# Patient Record
Sex: Male | Born: 1975 | Race: White | Hispanic: No | Marital: Married | Smoking: Former smoker
Health system: Southern US, Community
[De-identification: ages and names within clinical notes are randomized; demographics above are authoritative.]

## PROBLEM LIST (undated history)

## (undated) DIAGNOSIS — L409 Psoriasis, unspecified: Secondary | ICD-10-CM

---

## 2016-01-04 ENCOUNTER — Emergency Department (HOSPITAL_COMMUNITY)
Admission: EM | Admit: 2016-01-04 | Discharge: 2016-01-04 | Disposition: A | Discharge status: Home / Self Care | Payer: Worker's Compensation | Attending: Emergency Medicine | Admitting: Emergency Medicine

## 2016-01-04 ENCOUNTER — Encounter (HOSPITAL_COMMUNITY): Payer: Self-pay | Admitting: Neurology

## 2016-01-04 ENCOUNTER — Emergency Department (HOSPITAL_COMMUNITY): Payer: Worker's Compensation

## 2016-01-04 DIAGNOSIS — Y92239 Unspecified place in hospital as the place of occurrence of the external cause: Secondary | ICD-10-CM | POA: Diagnosis not present

## 2016-01-04 DIAGNOSIS — Y9389 Activity, other specified: Secondary | ICD-10-CM | POA: Diagnosis not present

## 2016-01-04 DIAGNOSIS — W278XXA Contact with other nonpowered hand tool, initial encounter: Secondary | ICD-10-CM | POA: Insufficient documentation

## 2016-01-04 DIAGNOSIS — Y990 Civilian activity done for income or pay: Secondary | ICD-10-CM | POA: Insufficient documentation

## 2016-01-04 DIAGNOSIS — Z23 Encounter for immunization: Secondary | ICD-10-CM | POA: Insufficient documentation

## 2016-01-04 DIAGNOSIS — R55 Syncope and collapse: Secondary | ICD-10-CM | POA: Insufficient documentation

## 2016-01-04 DIAGNOSIS — S51812A Laceration without foreign body of left forearm, initial encounter: Secondary | ICD-10-CM

## 2016-01-04 DIAGNOSIS — Z87891 Personal history of nicotine dependence: Secondary | ICD-10-CM | POA: Diagnosis not present

## 2016-01-04 HISTORY — DX: Psoriasis, unspecified: L40.9

## 2016-01-04 MED ORDER — ACETAMINOPHEN 500 MG PO TABS: 1000.0000 mg | ORAL_TABLET | Freq: Four times a day (QID) | ORAL | 0 refills | Status: AC | PRN

## 2016-01-04 MED ORDER — LIDOCAINE-EPINEPHRINE (PF) 2 %-1:200000 IJ SOLN
20.0000 mL | Freq: Once | INTRAMUSCULAR | Status: AC
  Administered 2016-01-04: 20 mL
  Filled 2016-01-04: qty 20

## 2016-01-04 MED ORDER — TRAMADOL HCL 50 MG PO TABS: 100.0000 mg | ORAL_TABLET | Freq: Four times a day (QID) | ORAL | 0 refills | Status: AC | PRN

## 2016-01-04 MED ORDER — MORPHINE SULFATE (PF) 4 MG/ML IV SOLN
4.0000 mg | Freq: Once | INTRAVENOUS | Status: AC
  Administered 2016-01-04: 4 mg via INTRAVENOUS
  Filled 2016-01-04: qty 1

## 2016-01-04 MED ORDER — TETANUS-DIPHTH-ACELL PERTUSSIS 5-2.5-18.5 LF-MCG/0.5 IM SUSP
0.5000 mL | Freq: Once | INTRAMUSCULAR | Status: AC
  Administered 2016-01-04: 0.5 mL via INTRAMUSCULAR
  Filled 2016-01-04: qty 0.5

## 2016-01-04 MED FILL — Medication: Qty: 1 | Status: AC

## 2016-01-04 NOTE — ED Notes (Signed)
Pt returned from xray. Talking to wife on the phone. PA to bedside to assess wound to prepare to suture .

## 2016-01-04 NOTE — ED Provider Notes (Signed)
LACERATION REPAIR Performed by: Cleon DewGEIPLE,Clance Baquero S, O'Connor PA-S2 Authorized by: Carolee RotaGEIPLE,Kelise Kuch S Consent: Verbal consent obtained. Risks and benefits: risks, benefits and alternatives were discussed Consent given by: patient Patient identity confirmed: provided demographic data Prepped and Draped in normal sterile fashion Wound explored  Laceration Location: L upper arm  Laceration Length: 7cm  No Foreign Bodies seen or palpated  Anesthesia: local infiltration  Local anesthetic: lidocaine 2% with epinephrine  Anesthetic total: 12 ml  Irrigation method: bulb suction, bulk flow with 1000cc+ of NS Amount of cleaning: copius  Skin closure: 4-0 Vicryl, muscle closure #2 4-0 Vicryl, fascial closure #4 4-0 Ethilon, superficial closure #11  Technique: simple interrupted  Patient tolerance: Patient tolerated the procedure well with no immediate complications.   The patient was urged to return to the Emergency Department urgently with worsening pain, swelling, expanding erythema especially if it streaks away from the affected area, fever, or if they have any other concerns. Patient verbalized understanding.     Renne CriglerJoshua Saiya Crist, PA-C 01/04/16 1423    Steve BarretteMarcy Pfeiffer, MD 01/05/16 1036

## 2016-01-04 NOTE — ED Triage Notes (Signed)
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 RNs were nearby, sat him in wheelchair and applied pressure. While sitting had syncope episode, agonal breathing, assisted to the floor. At current pt is a x 4.

## 2016-01-04 NOTE — ED Notes (Signed)
PA finished with suturing

## 2016-01-04 NOTE — ED Provider Notes (Signed)
[facility]-EMERGENCY DEPT Provider Note   CSN: [id number] Arrival date & time: 01/04/16  1116     History   Chief Complaint Chief Complaint  Patient presents with  . Laceration    HPI Steve Sandoval is a 40 y.o. male.  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 He was using a screwdriver and ended up forcefully stabbing himself in the left, volar forearm. No fall or other injury. Patient was being transported down to the emergency department when he became sweaty lightheaded and passed out. Transporters placed patient in supine position and he regained consciousness. He reports he does have normal use of the hand. Past Medical History:  Diagnosis Date  . Psoriasis     There are no active problems to display for this patient.   History reviewed. No pertinent surgical history.     Home Medications    Prior to Admission medications   Medication Sig Start Date End Date Taking? Authorizing Provider  acetaminophen (TYLENOL) 500 MG tablet Take 2 tablets (1,000 mg total) by mouth every 6 (six) hours as needed. 01/04/16   Arby BarretteMarcy Damel Querry, MD  traMADol (ULTRAM) 50 MG tablet Take 2 tablets (100 mg total) by mouth every 6 (six) hours as needed. 01/04/16   Arby BarretteMarcy Reinaldo Helt, MD    Family History No family history on file.  Social History Social History  Substance Use Topics  . Smoking status: Former Games developermoker  . Smokeless tobacco: Not on file  . Alcohol use Yes     Allergies   Patient has no known allergies.   Review of Systems Review of Systems  Constitutional: No recent fever chills or general illness Cardiovascular: No chest pain, history of exertional chest pain or other syncopal episodes. Physical Exam Updated Vital Signs BP 140/93 (BP Location: Right Arm)   Pulse 100   Temp 97.9 F (36.6 C) (Oral)   Resp 23   Ht 6\' 1"  (1.854 m)   Wt 230 lb (104.3 kg)   SpO2 98%   BMI 30.34 kg/m   Physical Exam  Constitutional: He is oriented to person, place,  and time.  Patient is alert and nontoxic. No respiratory distress. Mental status clear. Color good.  HENT:  Head: Normocephalic and atraumatic.  Eyes: EOM are normal.  Cardiovascular: Normal rate, regular rhythm, normal heart sounds and intact distal pulses.   Pulmonary/Chest: Effort normal and breath sounds normal.  Abdominal: Soft. He exhibits no distension. There is no tenderness.  Musculoskeletal:  Left volar forearm has a laceration to the mid forearm. This is approximate 7 cm with the skin layers being completely penetrated showing a clean muscle body below with one defect in the muscle body consistent with a deeper puncture. Slow ooze of bleeding, no pulsatile bleeding. Patient's strength of the hand is intact. Pulse 2+.  Neurological: He is alert and oriented to person, place, and time. No cranial nerve deficit. He exhibits normal muscle tone. Coordination normal.  Skin: Skin is warm and dry.  Psychiatric: He has a normal mood and affect.     ED Treatments / Results  Labs (all labs ordered are listed, but only abnormal results are displayed) Labs Reviewed - No data to display  EKG  EKG Interpretation None       Radiology Dg Forearm Left  Result Date: 01/04/2016 CLINICAL DATA:  Laceration and soft tissue wound of left forearm. Initial encounter. EXAM: LEFT FOREARM - 2 VIEW COMPARISON:  None. FINDINGS: No fracture is identified. Dressing noted overlying mid  to proximal forearm soft tissues. No evidence of soft tissue foreign body. No bony lesions. IMPRESSION: No evidence of fracture or soft tissue foreign body. Electronically Signed   By: Irish LackGlenn  Yamagata M.D.   On: 01/04/2016 12:16    Procedures Procedures (including critical care time) Laceration. Repair done with PA-C Geipel. I have examined the wound and explored with sterile glove. Muscle body has punctured it does not appear to traverse any tendon or nerve structure. Patient is able to flex and extend the fingers and  the wrist and I do not feel movement of tendon beneath my finger. I have anesthetized the wound with 2% lidocaine with epinephrine with good result. One residual venous vessel required cauterization in the fatty tissues of the skin. Wound is copiously irrigated. At that point the bleeding controlled and PA-C-Geipel completed irrigation and repair. Medications Ordered in ED Medications  Tdap (BOOSTRIX) injection 0.5 mL (0.5 mLs Intramuscular Given 01/04/16 1130)  lidocaine-EPINEPHrine (XYLOCAINE W/EPI) 2 %-1:200000 (PF) injection 20 mL (20 mLs Infiltration Given 01/04/16 1222)  morphine 4 MG/ML injection 4 mg (4 mg Intravenous Given 01/04/16 1246)     Initial Impression / Assessment and Plan / ED Course  I have reviewed the triage vital signs and the nursing notes.  Pertinent labs & imaging results that were available during my care of the patient were reviewed by me and considered in my medical decision making (see chart for details).  Clinical Course     Final Clinical Impressions(s) / ED Diagnoses   Final diagnoses:  Forearm laceration with complication, left, initial encounter  Vasovagal syncope   Patient sustained forearm wound is outlined. Repair done in the emergency department. He experienced vagal syncope during transport to the emergency department. At this time no indication of acute cardiovascular problem or other trauma. New Prescriptions New Prescriptions   ACETAMINOPHEN (TYLENOL) 500 MG TABLET    Take 2 tablets (1,000 mg total) by mouth every 6 (six) hours as needed.   TRAMADOL (ULTRAM) 50 MG TABLET    Take 2 tablets (100 mg total) by mouth every 6 (six) hours as needed.     Arby BarretteMarcy Ermalinda Joubert, MD 01/04/16 [phone]

## 2016-01-04 NOTE — Discharge Instructions (Signed)
Stitches out in 10 days. See her family doctor for recheck. If you have signs of wound infection such as redness drainage, fever or other concerns return to the emergency department.

## 2017-10-27 IMAGING — DX DG FOREARM 2V*L*
2 series · 2 of 2 positions shown · non-contrast
Comparison: None.

CLINICAL DATA: Laceration and soft tissue wound of left forearm.
Initial encounter.

EXAM:
LEFT FOREARM - 2 VIEW

[x forearm ap left]
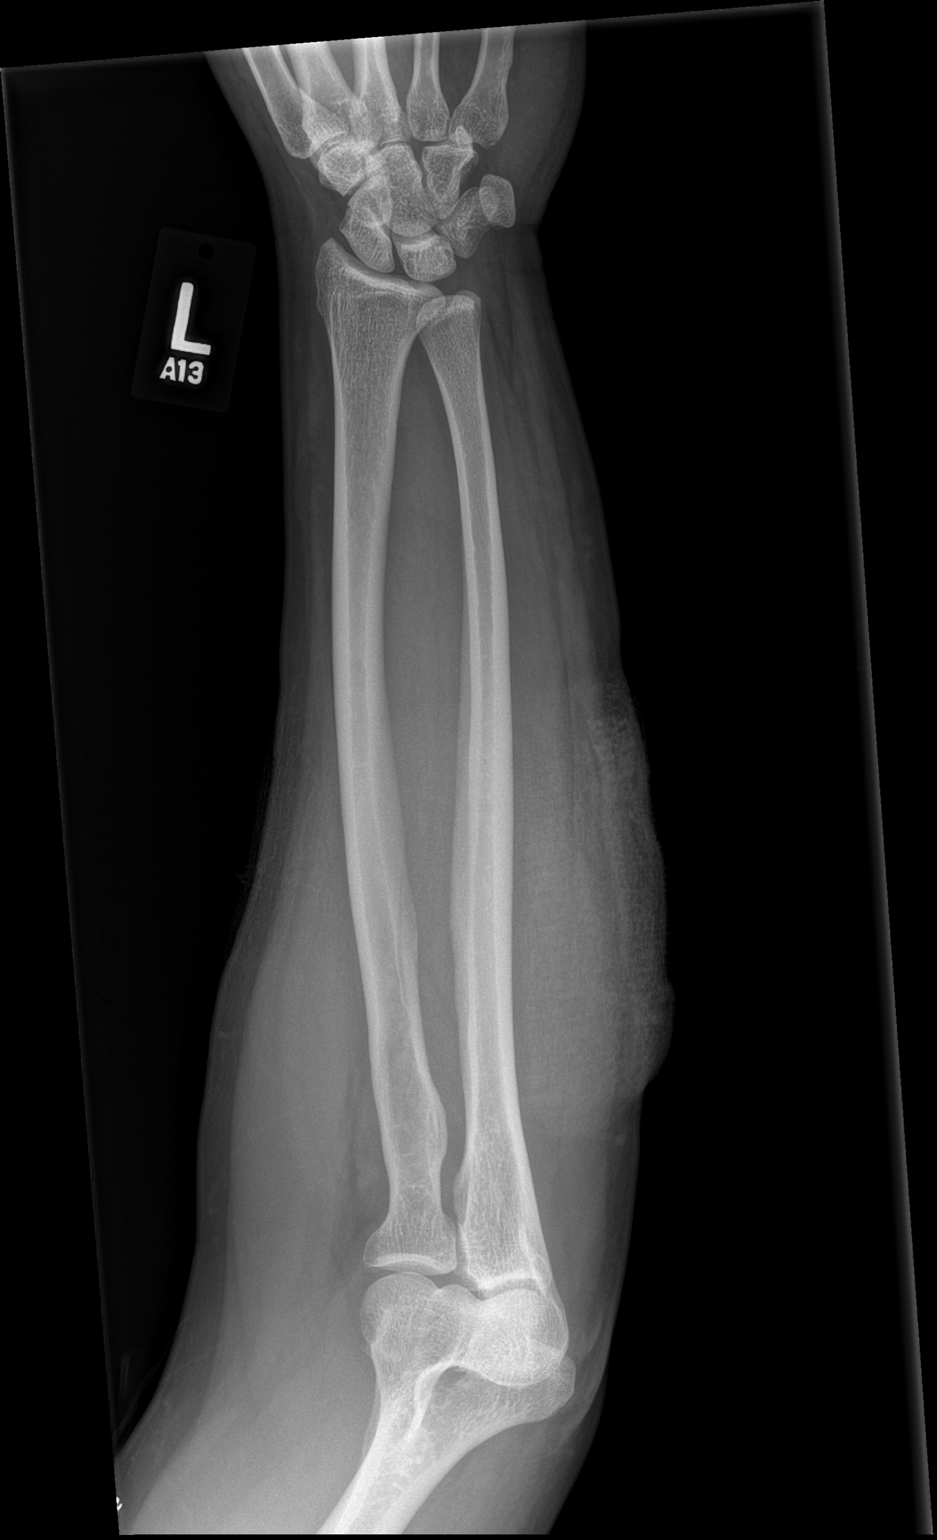

[x forearm lat left]
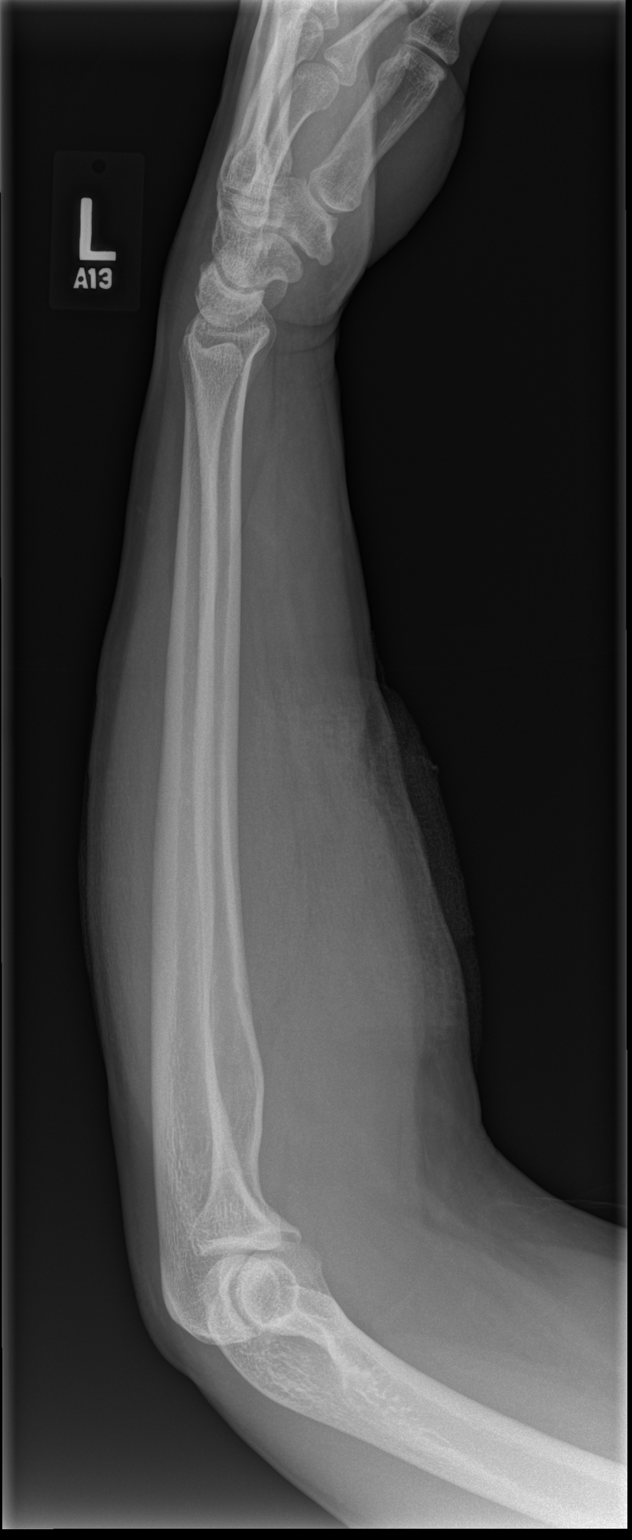

[2 of 2 positions shown; findings below may reference images not displayed]

FINDINGS: No fracture is identified. Dressing noted overlying mid to proximal
forearm soft tissues. No evidence of soft tissue foreign body. No
bony lesions.
IMPRESSION: No evidence of fracture or soft tissue foreign body.
